# Patient Record
Sex: Male | Born: 1955 | Race: White | Hispanic: No | Marital: Married | Smoking: Never smoker
Health system: Southern US, Community
[De-identification: ages and names within clinical notes are randomized; demographics above are authoritative.]

## PROBLEM LIST (undated history)

## (undated) DIAGNOSIS — N200 Calculus of kidney: Secondary | ICD-10-CM

## (undated) DIAGNOSIS — C4492 Squamous cell carcinoma of skin, unspecified: Secondary | ICD-10-CM

## (undated) DIAGNOSIS — K5790 Diverticulosis of intestine, part unspecified, without perforation or abscess without bleeding: Secondary | ICD-10-CM

## (undated) DIAGNOSIS — K649 Unspecified hemorrhoids: Secondary | ICD-10-CM

## (undated) HISTORY — PX: VASECTOMY: SHX75

## (undated) HISTORY — PX: OTHER SURGICAL HISTORY: SHX169

## (undated) HISTORY — DX: Squamous cell carcinoma of skin, unspecified: C44.92

## (undated) HISTORY — DX: Unspecified hemorrhoids: K64.9

## (undated) HISTORY — DX: Calculus of kidney: N20.0

## (undated) HISTORY — DX: Diverticulosis of intestine, part unspecified, without perforation or abscess without bleeding: K57.90

## (undated) HISTORY — PX: AV FISTULA REPAIR: SHX563

---

## 2000-08-16 ENCOUNTER — Emergency Department (HOSPITAL_COMMUNITY): Admission: EM | Admit: 2000-08-16 | Discharge: 2000-08-16 | Payer: Self-pay | Admitting: Emergency Medicine

## 2000-08-16 ENCOUNTER — Encounter: Payer: Self-pay | Admitting: Emergency Medicine

## 2007-04-11 ENCOUNTER — Encounter: Admission: RE | Admit: 2007-04-11 | Discharge: 2007-04-11 | Payer: Self-pay | Admitting: Family Medicine

## 2008-04-24 ENCOUNTER — Encounter: Admission: RE | Admit: 2008-04-24 | Discharge: 2008-04-24 | Payer: Self-pay | Admitting: Specialist

## 2010-05-05 ENCOUNTER — Observation Stay (HOSPITAL_COMMUNITY)
Admission: EM | Admit: 2010-05-05 | Discharge: 2010-05-07 | Payer: Self-pay | Source: Home / Self Care | Attending: Internal Medicine | Admitting: Internal Medicine

## 2010-05-06 ENCOUNTER — Other Ambulatory Visit: Payer: Self-pay | Admitting: Internal Medicine

## 2010-07-14 LAB — CARDIAC PANEL(CRET KIN+CKTOT+MB+TROPI)
CK, MB: 1 ng/mL (ref 0.3–4.0)
Total CK: 91 U/L (ref 7–232)
Total CK: 98 U/L (ref 7–232)
Troponin I: 0.02 ng/mL (ref 0.00–0.06)
Troponin I: 0.06 ng/mL (ref 0.00–0.06)

## 2010-07-14 LAB — CBC
HCT: 45.3 % (ref 39.0–52.0)
Hemoglobin: 15.7 g/dL (ref 13.0–17.0)
MCHC: 34.7 g/dL (ref 30.0–36.0)
RBC: 5.01 MIL/uL (ref 4.22–5.81)

## 2010-07-14 LAB — DIFFERENTIAL
Lymphs Abs: 1.3 10*3/uL (ref 0.7–4.0)
Monocytes Absolute: 0.4 10*3/uL (ref 0.1–1.0)
Monocytes Relative: 8 % (ref 3–12)
Neutro Abs: 3.8 10*3/uL (ref 1.7–7.7)
Neutrophils Relative %: 67 % (ref 43–77)

## 2010-07-14 LAB — POCT I-STAT, CHEM 8
BUN: 12 mg/dL (ref 6–23)
Calcium, Ion: 1.1 mmol/L — ABNORMAL LOW (ref 1.12–1.32)
Chloride: 105 mEq/L (ref 96–112)
Creatinine, Ser: 1.1 mg/dL (ref 0.4–1.5)
Glucose, Bld: 127 mg/dL — ABNORMAL HIGH (ref 70–99)
HCT: 47 % (ref 39.0–52.0)
Potassium: 3.9 mEq/L (ref 3.5–5.1)

## 2010-07-14 LAB — PROTIME-INR: Prothrombin Time: 13.2 seconds (ref 11.6–15.2)

## 2010-07-14 LAB — CULTURE, BLOOD (ROUTINE X 2)
Culture  Setup Time: 201201022322
Culture  Setup Time: 201201022322
Culture: NO GROWTH
Culture: NO GROWTH

## 2010-07-14 LAB — PHOSPHORUS: Phosphorus: 3.4 mg/dL (ref 2.3–4.6)

## 2010-07-14 LAB — COMPREHENSIVE METABOLIC PANEL
AST: 26 U/L (ref 0–37)
Albumin: 3.7 g/dL (ref 3.5–5.2)
Alkaline Phosphatase: 54 U/L (ref 39–117)
BUN: 12 mg/dL (ref 6–23)
Chloride: 104 mEq/L (ref 96–112)
GFR calc Af Amer: >60 mL/min (ref >60)
Potassium: 3.6 mEq/L (ref 3.5–5.1)
Total Protein: 6.6 g/dL (ref 6.0–8.3)

## 2010-07-14 LAB — LIPID PANEL
HDL: 41 mg/dL (ref >39)
Triglycerides: 177 mg/dL — ABNORMAL HIGH (ref <150)
VLDL: 35 mg/dL (ref 0–40)

## 2010-07-14 LAB — BRAIN NATRIURETIC PEPTIDE: Pro B Natriuretic peptide (BNP): <30 pg/mL (ref 0.0–100.0)

## 2010-07-14 LAB — CK TOTAL AND CKMB (NOT AT ARMC)
CK, MB: 1.4 ng/mL (ref 0.3–4.0)
Relative Index: 1.1 (ref 0.0–2.5)
Total CK: 130 U/L (ref 7–232)

## 2010-07-14 LAB — TSH: TSH: 0.979 u[IU]/mL (ref 0.350–4.500)

## 2010-07-14 LAB — POCT CARDIAC MARKERS
CKMB, poc: <1 ng/mL — ABNORMAL LOW (ref 1.0–8.0)
Troponin i, poc: <0.05 ng/mL (ref 0.00–0.09)

## 2010-07-14 LAB — TROPONIN I: Troponin I: 0.02 ng/mL (ref 0.00–0.06)

## 2010-08-23 ENCOUNTER — Emergency Department (HOSPITAL_COMMUNITY): Payer: BC Managed Care – PPO

## 2010-08-23 ENCOUNTER — Emergency Department (HOSPITAL_COMMUNITY)
Admission: EM | Admit: 2010-08-23 | Discharge: 2010-08-23 | Disposition: A | Discharge status: Home / Self Care | Payer: BC Managed Care – PPO | Attending: Emergency Medicine | Admitting: Emergency Medicine

## 2010-08-23 DIAGNOSIS — N200 Calculus of kidney: Secondary | ICD-10-CM | POA: Insufficient documentation

## 2010-08-23 DIAGNOSIS — E7800 Pure hypercholesterolemia, unspecified: Secondary | ICD-10-CM | POA: Insufficient documentation

## 2010-08-23 DIAGNOSIS — R1031 Right lower quadrant pain: Secondary | ICD-10-CM | POA: Insufficient documentation

## 2010-08-23 DIAGNOSIS — R10813 Right lower quadrant abdominal tenderness: Secondary | ICD-10-CM | POA: Insufficient documentation

## 2010-08-23 DIAGNOSIS — R110 Nausea: Secondary | ICD-10-CM | POA: Insufficient documentation

## 2010-08-23 LAB — POCT I-STAT, CHEM 8
BUN: 24 mg/dL — ABNORMAL HIGH (ref 6–23)
Calcium, Ion: 1.12 mmol/L (ref 1.12–1.32)
Chloride: 109 mEq/L (ref 96–112)
Glucose, Bld: 102 mg/dL — ABNORMAL HIGH (ref 70–99)

## 2010-08-23 LAB — DIFFERENTIAL
Basophils Absolute: 0 10*3/uL (ref 0.0–0.1)
Basophils Relative: 1 % (ref 0–1)
Eosinophils Absolute: 0.1 10*3/uL (ref 0.0–0.7)
Eosinophils Relative: 2 % (ref 0–5)

## 2010-08-23 LAB — CBC
Platelets: 186 10*3/uL (ref 150–400)
RDW: 12.8 % (ref 11.5–15.5)
WBC: 5.5 10*3/uL (ref 4.0–10.5)

## 2010-08-23 LAB — HEPATIC FUNCTION PANEL
AST: 25 U/L (ref 0–37)
Albumin: 4.1 g/dL (ref 3.5–5.2)
Bilirubin, Direct: 0.2 mg/dL (ref 0.0–0.3)
Total Protein: 7.6 g/dL (ref 6.0–8.3)

## 2010-08-23 LAB — URINALYSIS, ROUTINE W REFLEX MICROSCOPIC
Ketones, ur: NEGATIVE mg/dL
Nitrite: NEGATIVE
Protein, ur: NEGATIVE mg/dL

## 2010-08-23 MED ORDER — IOHEXOL 300 MG/ML  SOLN
120.0000 mL | Freq: Once | INTRAMUSCULAR | Status: AC | PRN
  Administered 2010-08-23: 120 mL via INTRAVENOUS

## 2011-07-13 DIAGNOSIS — C439 Malignant melanoma of skin, unspecified: Secondary | ICD-10-CM

## 2011-07-13 HISTORY — DX: Malignant melanoma of skin, unspecified: C43.9

## 2014-05-10 ENCOUNTER — Encounter: Payer: Self-pay | Admitting: *Deleted

## 2015-09-10 DIAGNOSIS — C4492 Squamous cell carcinoma of skin, unspecified: Secondary | ICD-10-CM

## 2015-09-10 HISTORY — DX: Squamous cell carcinoma of skin, unspecified: C44.92

## 2016-05-29 DIAGNOSIS — I10 Essential (primary) hypertension: Secondary | ICD-10-CM | POA: Diagnosis not present

## 2016-06-16 DIAGNOSIS — M4316 Spondylolisthesis, lumbar region: Secondary | ICD-10-CM | POA: Diagnosis not present

## 2016-06-29 DIAGNOSIS — J069 Acute upper respiratory infection, unspecified: Secondary | ICD-10-CM | POA: Diagnosis not present

## 2016-06-29 DIAGNOSIS — R05 Cough: Secondary | ICD-10-CM | POA: Diagnosis not present

## 2016-07-22 DIAGNOSIS — J069 Acute upper respiratory infection, unspecified: Secondary | ICD-10-CM | POA: Diagnosis not present

## 2016-09-01 DIAGNOSIS — E7800 Pure hypercholesterolemia, unspecified: Secondary | ICD-10-CM | POA: Diagnosis not present

## 2016-09-01 DIAGNOSIS — Z0000 Encounter for general adult medical examination without abnormal findings: Secondary | ICD-10-CM | POA: Diagnosis not present

## 2016-09-14 DIAGNOSIS — M4316 Spondylolisthesis, lumbar region: Secondary | ICD-10-CM | POA: Diagnosis not present

## 2016-10-27 DIAGNOSIS — R109 Unspecified abdominal pain: Secondary | ICD-10-CM | POA: Diagnosis not present

## 2016-10-27 DIAGNOSIS — N201 Calculus of ureter: Secondary | ICD-10-CM | POA: Diagnosis not present

## 2016-10-27 DIAGNOSIS — N132 Hydronephrosis with renal and ureteral calculous obstruction: Secondary | ICD-10-CM | POA: Diagnosis not present

## 2016-12-22 DIAGNOSIS — M4316 Spondylolisthesis, lumbar region: Secondary | ICD-10-CM | POA: Diagnosis not present

## 2017-03-04 DIAGNOSIS — I10 Essential (primary) hypertension: Secondary | ICD-10-CM | POA: Diagnosis not present

## 2017-03-04 DIAGNOSIS — Z23 Encounter for immunization: Secondary | ICD-10-CM | POA: Diagnosis not present

## 2017-04-14 DIAGNOSIS — S20212A Contusion of left front wall of thorax, initial encounter: Secondary | ICD-10-CM | POA: Diagnosis not present

## 2017-04-22 DIAGNOSIS — S20219A Contusion of unspecified front wall of thorax, initial encounter: Secondary | ICD-10-CM | POA: Diagnosis not present

## 2017-04-22 DIAGNOSIS — R109 Unspecified abdominal pain: Secondary | ICD-10-CM | POA: Diagnosis not present

## 2017-05-10 DIAGNOSIS — Z23 Encounter for immunization: Secondary | ICD-10-CM | POA: Diagnosis not present

## 2017-05-15 DIAGNOSIS — J069 Acute upper respiratory infection, unspecified: Secondary | ICD-10-CM | POA: Diagnosis not present

## 2017-09-30 DIAGNOSIS — Z1159 Encounter for screening for other viral diseases: Secondary | ICD-10-CM | POA: Diagnosis not present

## 2017-09-30 DIAGNOSIS — E7800 Pure hypercholesterolemia, unspecified: Secondary | ICD-10-CM | POA: Diagnosis not present

## 2017-09-30 DIAGNOSIS — Z0000 Encounter for general adult medical examination without abnormal findings: Secondary | ICD-10-CM | POA: Diagnosis not present

## 2018-03-07 DIAGNOSIS — Z23 Encounter for immunization: Secondary | ICD-10-CM | POA: Diagnosis not present

## 2018-03-07 DIAGNOSIS — I10 Essential (primary) hypertension: Secondary | ICD-10-CM | POA: Diagnosis not present

## 2018-10-10 DIAGNOSIS — Z0000 Encounter for general adult medical examination without abnormal findings: Secondary | ICD-10-CM | POA: Diagnosis not present

## 2018-10-18 DIAGNOSIS — Z125 Encounter for screening for malignant neoplasm of prostate: Secondary | ICD-10-CM | POA: Diagnosis not present

## 2018-10-18 DIAGNOSIS — R5383 Other fatigue: Secondary | ICD-10-CM | POA: Diagnosis not present

## 2018-10-18 DIAGNOSIS — I10 Essential (primary) hypertension: Secondary | ICD-10-CM | POA: Diagnosis not present

## 2018-10-18 DIAGNOSIS — N529 Male erectile dysfunction, unspecified: Secondary | ICD-10-CM | POA: Diagnosis not present

## 2018-11-25 DIAGNOSIS — Z79890 Hormone replacement therapy (postmenopausal): Secondary | ICD-10-CM | POA: Diagnosis not present

## 2018-11-25 DIAGNOSIS — E291 Testicular hypofunction: Secondary | ICD-10-CM | POA: Diagnosis not present

## 2018-12-06 DIAGNOSIS — E291 Testicular hypofunction: Secondary | ICD-10-CM | POA: Diagnosis not present

## 2018-12-16 DIAGNOSIS — E291 Testicular hypofunction: Secondary | ICD-10-CM | POA: Diagnosis not present

## 2019-04-11 DIAGNOSIS — Z23 Encounter for immunization: Secondary | ICD-10-CM | POA: Diagnosis not present

## 2019-04-11 DIAGNOSIS — I10 Essential (primary) hypertension: Secondary | ICD-10-CM | POA: Diagnosis not present

## 2019-04-11 DIAGNOSIS — E291 Testicular hypofunction: Secondary | ICD-10-CM | POA: Diagnosis not present

## 2019-05-16 DIAGNOSIS — F5221 Male erectile disorder: Secondary | ICD-10-CM | POA: Diagnosis not present

## 2019-06-06 DIAGNOSIS — N5201 Erectile dysfunction due to arterial insufficiency: Secondary | ICD-10-CM | POA: Diagnosis not present

## 2019-06-20 DIAGNOSIS — Z85820 Personal history of malignant melanoma of skin: Secondary | ICD-10-CM | POA: Diagnosis not present

## 2019-06-20 DIAGNOSIS — L570 Actinic keratosis: Secondary | ICD-10-CM | POA: Diagnosis not present

## 2019-06-20 DIAGNOSIS — B079 Viral wart, unspecified: Secondary | ICD-10-CM | POA: Diagnosis not present

## 2019-06-20 DIAGNOSIS — D229 Melanocytic nevi, unspecified: Secondary | ICD-10-CM | POA: Diagnosis not present

## 2019-08-01 ENCOUNTER — Ambulatory Visit (INDEPENDENT_AMBULATORY_CARE_PROVIDER_SITE_OTHER): Payer: BC Managed Care – PPO | Admitting: Dermatology

## 2019-08-01 ENCOUNTER — Other Ambulatory Visit: Payer: Self-pay

## 2019-08-01 ENCOUNTER — Encounter: Payer: Self-pay | Admitting: Dermatology

## 2019-08-01 DIAGNOSIS — L570 Actinic keratosis: Secondary | ICD-10-CM

## 2019-08-01 NOTE — Progress Notes (Addendum)
   Follow-Up Visit    Subjective  Timothy Parker is a 64 y.o. male who presents for the following: Follow-up (Previous visit LN2 forearms left and right also both hands. Some spots are gone and some are present.).  AKs Location: Arms and hands  Duration: Months Quality: Improved not clear Associated Signs/Symptoms: Sometimes itch Modifying Factors: Previous freezing Severity:  Timing: Context:   The following portions of the chart were reviewed this encounter and updated as appropriate:     Objective  Well appearing patient in no apparent distress; mood and affect are within normal limits.  A focused examination was performed including hands, arms, face, head and neck. Relevant physical exam findings are noted in the Assessment and Plan. Four residual H-AKs.  Assessment & Plan  AK (actinic keratosis) (4) Left Hand - Posterior; Right Hand - Posterior; Left Forearm - Posterior; Right Forearm - Posterior  Destruction of lesion - Left Forearm - Posterior, Left Hand - Posterior, Right Forearm - Posterior, Right Hand - Posterior Complexity: simple   Destruction method: cryotherapy   Informed consent: discussed and consent obtained   Timeout:  patient name, date of birth, surgical site, and procedure verified Lesion destroyed using liquid nitrogen: Yes   Region frozen until ice ball extended beyond lesion: Yes   Outcome: patient tolerated procedure well with no complications   Post-procedure details: wound care instructions given   LN2 freeze, 5 seconds each.

## 2019-10-10 DIAGNOSIS — E291 Testicular hypofunction: Secondary | ICD-10-CM | POA: Diagnosis not present

## 2019-10-10 DIAGNOSIS — Z0000 Encounter for general adult medical examination without abnormal findings: Secondary | ICD-10-CM | POA: Diagnosis not present

## 2019-10-10 DIAGNOSIS — I10 Essential (primary) hypertension: Secondary | ICD-10-CM | POA: Diagnosis not present

## 2019-10-10 DIAGNOSIS — E7800 Pure hypercholesterolemia, unspecified: Secondary | ICD-10-CM | POA: Diagnosis not present

## 2019-10-10 DIAGNOSIS — Z125 Encounter for screening for malignant neoplasm of prostate: Secondary | ICD-10-CM | POA: Diagnosis not present

## 2019-12-01 DIAGNOSIS — M940 Chondrocostal junction syndrome [Tietze]: Secondary | ICD-10-CM | POA: Diagnosis not present

## 2020-07-30 DIAGNOSIS — R972 Elevated prostate specific antigen [PSA]: Secondary | ICD-10-CM | POA: Diagnosis not present

## 2020-07-30 DIAGNOSIS — I10 Essential (primary) hypertension: Secondary | ICD-10-CM | POA: Diagnosis not present

## 2020-07-30 DIAGNOSIS — N529 Male erectile dysfunction, unspecified: Secondary | ICD-10-CM | POA: Diagnosis not present

## 2020-07-30 DIAGNOSIS — E291 Testicular hypofunction: Secondary | ICD-10-CM | POA: Diagnosis not present

## 2020-12-17 DIAGNOSIS — H25811 Combined forms of age-related cataract, right eye: Secondary | ICD-10-CM | POA: Diagnosis not present

## 2021-01-02 DIAGNOSIS — H2511 Age-related nuclear cataract, right eye: Secondary | ICD-10-CM | POA: Diagnosis not present

## 2021-01-02 DIAGNOSIS — H25811 Combined forms of age-related cataract, right eye: Secondary | ICD-10-CM | POA: Diagnosis not present

## 2021-01-16 DIAGNOSIS — H2512 Age-related nuclear cataract, left eye: Secondary | ICD-10-CM | POA: Diagnosis not present

## 2021-05-20 DIAGNOSIS — U071 COVID-19: Secondary | ICD-10-CM | POA: Diagnosis not present

## 2021-05-27 DIAGNOSIS — E7800 Pure hypercholesterolemia, unspecified: Secondary | ICD-10-CM | POA: Diagnosis not present

## 2021-05-27 DIAGNOSIS — I10 Essential (primary) hypertension: Secondary | ICD-10-CM | POA: Diagnosis not present

## 2021-05-27 DIAGNOSIS — Z23 Encounter for immunization: Secondary | ICD-10-CM | POA: Diagnosis not present

## 2021-05-27 DIAGNOSIS — Z0000 Encounter for general adult medical examination without abnormal findings: Secondary | ICD-10-CM | POA: Diagnosis not present

## 2021-05-27 DIAGNOSIS — R972 Elevated prostate specific antigen [PSA]: Secondary | ICD-10-CM | POA: Diagnosis not present

## 2021-06-03 DIAGNOSIS — N5201 Erectile dysfunction due to arterial insufficiency: Secondary | ICD-10-CM | POA: Diagnosis not present

## 2021-06-03 DIAGNOSIS — R972 Elevated prostate specific antigen [PSA]: Secondary | ICD-10-CM | POA: Diagnosis not present

## 2021-08-18 DIAGNOSIS — R972 Elevated prostate specific antigen [PSA]: Secondary | ICD-10-CM | POA: Diagnosis not present

## 2021-09-05 DIAGNOSIS — R972 Elevated prostate specific antigen [PSA]: Secondary | ICD-10-CM | POA: Diagnosis not present

## 2021-09-05 DIAGNOSIS — N3943 Post-void dribbling: Secondary | ICD-10-CM | POA: Diagnosis not present

## 2021-09-09 ENCOUNTER — Other Ambulatory Visit: Payer: Self-pay | Admitting: Urology

## 2021-09-09 DIAGNOSIS — R972 Elevated prostate specific antigen [PSA]: Secondary | ICD-10-CM

## 2021-09-30 ENCOUNTER — Ambulatory Visit
Admission: RE | Admit: 2021-09-30 | Discharge: 2021-09-30 | Disposition: A | Discharge status: Home / Self Care | Payer: Medicare Other | Source: Ambulatory Visit | Attending: Urology | Admitting: Urology

## 2021-09-30 DIAGNOSIS — K5730 Diverticulosis of large intestine without perforation or abscess without bleeding: Secondary | ICD-10-CM | POA: Diagnosis not present

## 2021-09-30 DIAGNOSIS — R972 Elevated prostate specific antigen [PSA]: Secondary | ICD-10-CM | POA: Diagnosis not present

## 2021-09-30 MED ORDER — GADOBENATE DIMEGLUMINE 529 MG/ML IV SOLN
20.0000 mL | Freq: Once | INTRAVENOUS | Status: AC | PRN
  Administered 2021-09-30: 20 mL via INTRAVENOUS

## 2021-12-02 DIAGNOSIS — R972 Elevated prostate specific antigen [PSA]: Secondary | ICD-10-CM | POA: Diagnosis not present

## 2021-12-09 DIAGNOSIS — N3943 Post-void dribbling: Secondary | ICD-10-CM | POA: Diagnosis not present

## 2021-12-09 DIAGNOSIS — R972 Elevated prostate specific antigen [PSA]: Secondary | ICD-10-CM | POA: Diagnosis not present

## 2022-05-07 DIAGNOSIS — E7800 Pure hypercholesterolemia, unspecified: Secondary | ICD-10-CM | POA: Diagnosis not present

## 2022-05-07 DIAGNOSIS — I10 Essential (primary) hypertension: Secondary | ICD-10-CM | POA: Diagnosis not present

## 2022-05-07 DIAGNOSIS — R972 Elevated prostate specific antigen [PSA]: Secondary | ICD-10-CM | POA: Diagnosis not present

## 2022-06-08 DIAGNOSIS — R972 Elevated prostate specific antigen [PSA]: Secondary | ICD-10-CM | POA: Diagnosis not present

## 2022-06-15 DIAGNOSIS — N3943 Post-void dribbling: Secondary | ICD-10-CM | POA: Diagnosis not present

## 2022-06-15 DIAGNOSIS — R972 Elevated prostate specific antigen [PSA]: Secondary | ICD-10-CM | POA: Diagnosis not present

## 2022-08-10 DIAGNOSIS — D075 Carcinoma in situ of prostate: Secondary | ICD-10-CM | POA: Diagnosis not present

## 2022-08-10 DIAGNOSIS — R972 Elevated prostate specific antigen [PSA]: Secondary | ICD-10-CM | POA: Diagnosis not present

## 2022-09-01 DIAGNOSIS — M25512 Pain in left shoulder: Secondary | ICD-10-CM | POA: Diagnosis not present

## 2022-09-09 DIAGNOSIS — M25612 Stiffness of left shoulder, not elsewhere classified: Secondary | ICD-10-CM | POA: Diagnosis not present

## 2022-09-09 DIAGNOSIS — M6281 Muscle weakness (generalized): Secondary | ICD-10-CM | POA: Diagnosis not present

## 2022-09-09 DIAGNOSIS — S46012D Strain of muscle(s) and tendon(s) of the rotator cuff of left shoulder, subsequent encounter: Secondary | ICD-10-CM | POA: Diagnosis not present

## 2022-09-09 DIAGNOSIS — M7542 Impingement syndrome of left shoulder: Secondary | ICD-10-CM | POA: Diagnosis not present

## 2022-09-16 DIAGNOSIS — M6281 Muscle weakness (generalized): Secondary | ICD-10-CM | POA: Diagnosis not present

## 2022-09-16 DIAGNOSIS — S46012D Strain of muscle(s) and tendon(s) of the rotator cuff of left shoulder, subsequent encounter: Secondary | ICD-10-CM | POA: Diagnosis not present

## 2022-09-16 DIAGNOSIS — M25612 Stiffness of left shoulder, not elsewhere classified: Secondary | ICD-10-CM | POA: Diagnosis not present

## 2022-09-16 DIAGNOSIS — M7542 Impingement syndrome of left shoulder: Secondary | ICD-10-CM | POA: Diagnosis not present

## 2022-09-24 DIAGNOSIS — M25612 Stiffness of left shoulder, not elsewhere classified: Secondary | ICD-10-CM | POA: Diagnosis not present

## 2022-09-24 DIAGNOSIS — S46012D Strain of muscle(s) and tendon(s) of the rotator cuff of left shoulder, subsequent encounter: Secondary | ICD-10-CM | POA: Diagnosis not present

## 2022-09-24 DIAGNOSIS — M6281 Muscle weakness (generalized): Secondary | ICD-10-CM | POA: Diagnosis not present

## 2022-09-24 DIAGNOSIS — M7542 Impingement syndrome of left shoulder: Secondary | ICD-10-CM | POA: Diagnosis not present

## 2022-10-01 DIAGNOSIS — M25612 Stiffness of left shoulder, not elsewhere classified: Secondary | ICD-10-CM | POA: Diagnosis not present

## 2022-10-01 DIAGNOSIS — M7542 Impingement syndrome of left shoulder: Secondary | ICD-10-CM | POA: Diagnosis not present

## 2022-10-01 DIAGNOSIS — M6281 Muscle weakness (generalized): Secondary | ICD-10-CM | POA: Diagnosis not present

## 2022-10-01 DIAGNOSIS — S46012D Strain of muscle(s) and tendon(s) of the rotator cuff of left shoulder, subsequent encounter: Secondary | ICD-10-CM | POA: Diagnosis not present

## 2022-10-13 DIAGNOSIS — M7542 Impingement syndrome of left shoulder: Secondary | ICD-10-CM | POA: Diagnosis not present

## 2022-10-27 DIAGNOSIS — Z0000 Encounter for general adult medical examination without abnormal findings: Secondary | ICD-10-CM | POA: Diagnosis not present

## 2022-10-27 DIAGNOSIS — I10 Essential (primary) hypertension: Secondary | ICD-10-CM | POA: Diagnosis not present

## 2022-10-27 DIAGNOSIS — E7800 Pure hypercholesterolemia, unspecified: Secondary | ICD-10-CM | POA: Diagnosis not present

## 2022-10-27 DIAGNOSIS — M109 Gout, unspecified: Secondary | ICD-10-CM | POA: Diagnosis not present

## 2022-10-27 DIAGNOSIS — K219 Gastro-esophageal reflux disease without esophagitis: Secondary | ICD-10-CM | POA: Diagnosis not present

## 2022-10-27 DIAGNOSIS — E669 Obesity, unspecified: Secondary | ICD-10-CM | POA: Diagnosis not present

## 2022-12-01 DIAGNOSIS — E7800 Pure hypercholesterolemia, unspecified: Secondary | ICD-10-CM | POA: Diagnosis not present

## 2023-02-02 DIAGNOSIS — R972 Elevated prostate specific antigen [PSA]: Secondary | ICD-10-CM | POA: Diagnosis not present

## 2023-02-09 DIAGNOSIS — N5201 Erectile dysfunction due to arterial insufficiency: Secondary | ICD-10-CM | POA: Diagnosis not present

## 2023-02-09 DIAGNOSIS — N3943 Post-void dribbling: Secondary | ICD-10-CM | POA: Diagnosis not present

## 2023-03-12 IMAGING — MR MR PROSTATE WO/W CM
12 series · 48 of 48 positions shown · IV contrast (multihance)
Comparison: None Available.

CLINICAL DATA: Elevated PSA.

EXAM:
MR PROSTATE WITHOUT AND WITH CONTRAST
TECHNIQUE: Multiplanar multisequence MRI images were obtained of the pelvis
centered about the prostate. Pre and post contrast images were
obtained.
CONTRAST:  20mL MULTIHANCE GADOBENATE DIMEGLUMINE 529 MG/ML IV SOLN

[Series 3: T2 · coronal · 3.0mm · 0.56mm/px · 1 of 24 slices shown (1 of 3)]
[im 1/24]
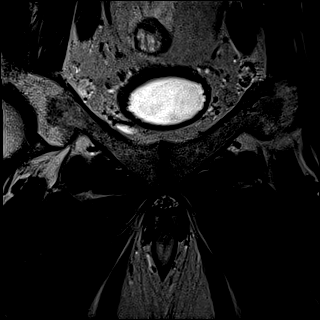

[Series 4: T1 · axial · 5.0mm · 1.25mm/px · z∈[-80,+155]mm · 2 of 96 slices shown]
[im 1/96]
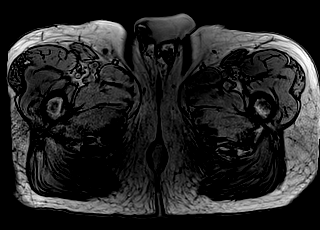
[im 96/96]
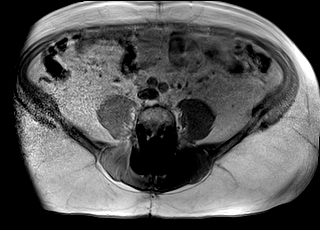

[Series 5: DWI · axial · 3.0mm · 1.75mm/px · z∈[-54,+33]mm · 2 of 90 slices shown (1 of 3)]
[im 1/90]
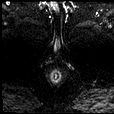
[im 90/90]
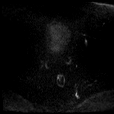

[Series 6: DWI · axial · 3.0mm · 1.75mm/px · 1 of 30 slices shown (2 of 3)]
[im 1/30]
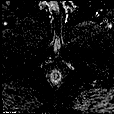

[Series 7: DWI · axial · 3.0mm · 1.75mm/px · 1 of 30 slices shown (3 of 3)]
[im 1/30]
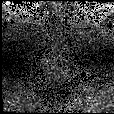

[Series 8: T2 · axial · 3.0mm · 0.56mm/px · 1 of 26 slices shown (2 of 3)]
[im 1/26]
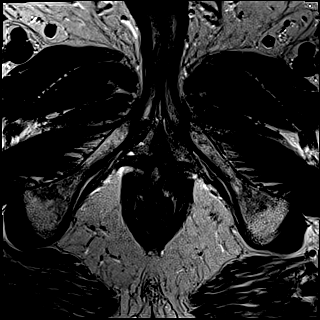

[Series 9: T2 · axial · 1.0mm · 1.04mm/px · z∈[-46,+33]mm · 2 of 80 slices shown (3 of 3)]
[im 1/80]
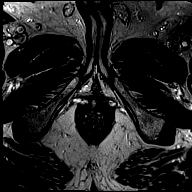
[im 80/80]
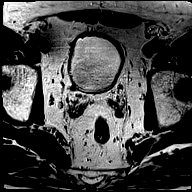

[Series 10: pre t1_twist_tra_dyn · axial · non-contrast · 3.5mm · 0.83mm/px · 1 of 22 slices shown]
[im 1/22]
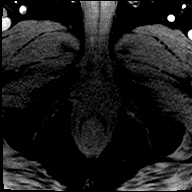

[Series 11: post t1_twist_tra_dyn-copy center · axial · non-contrast · 3.5mm · 0.83mm/px · z∈[-46,+28]mm · 17 of 660 slices shown]
[im 1/660]
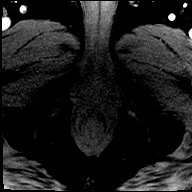
[im 42/660]
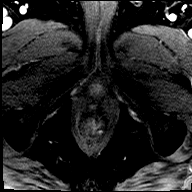
[im 83/660]
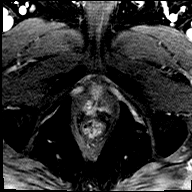
[im 124/660]
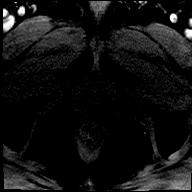
[im 165/660]
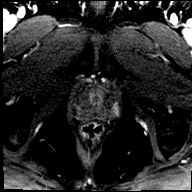
[im 206/660]
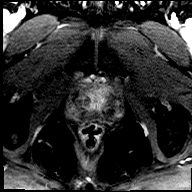
[im 248/660]
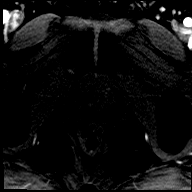
[im 289/660]
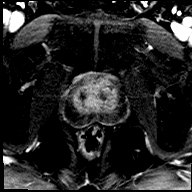
[im 330/660]
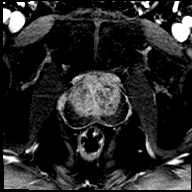
[im 371/660]
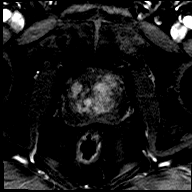
[im 412/660]
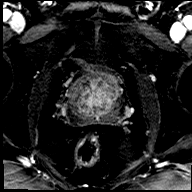
[im 454/660]
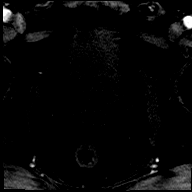
[im 495/660]
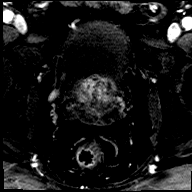
[im 536/660]
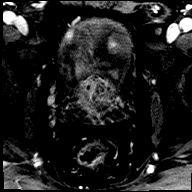
[im 577/660]
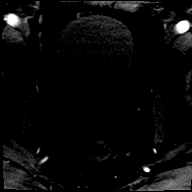
[im 618/660]
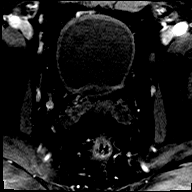
[im 660/660]
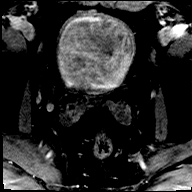

[Series 12: post t1_twist_tra_dyn-copy cent_sub · axial · 3.5mm · 0.83mm/px · z∈[-46,+28]mm · 16 of 638 slices shown]
[im 1/638]
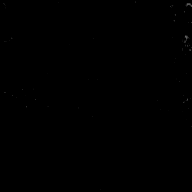
[im 43/638]
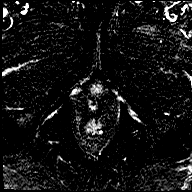
[im 85/638]
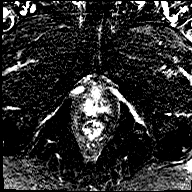
[im 128/638]
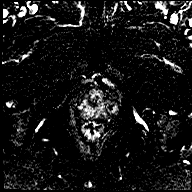
[im 170/638]
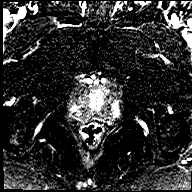
[im 213/638]
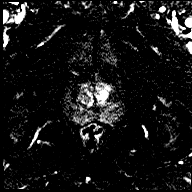
[im 255/638]
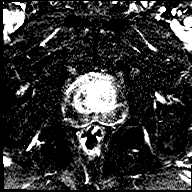
[im 298/638]
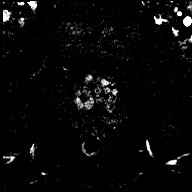
[im 340/638]
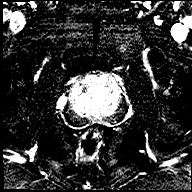
[im 383/638]
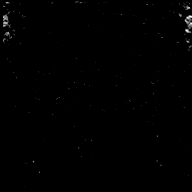
[im 425/638]
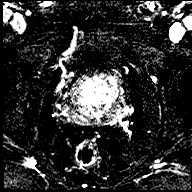
[im 468/638]
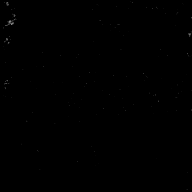
[im 510/638]
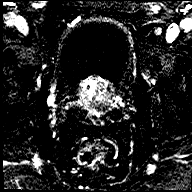
[im 553/638]
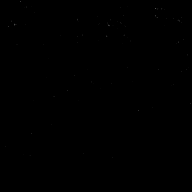
[im 595/638]
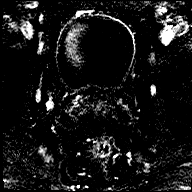
[im 638/638]
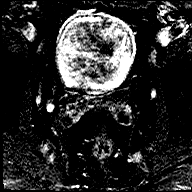

[Series 13: t1_vibe_dixon_tra_f · axial · 2.5mm · 0.91mm/px · z∈[-61,+136]mm · 2 of 80 slices shown]
[im 1/80]
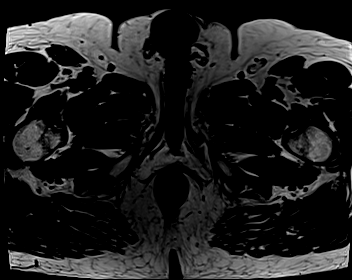
[im 80/80]
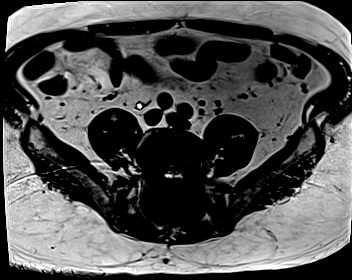

[Series 14: t1_vibe_dixon_tra_w · axial · 2.5mm · 0.91mm/px · z∈[-61,+136]mm · 2 of 80 slices shown]
[im 1/80]
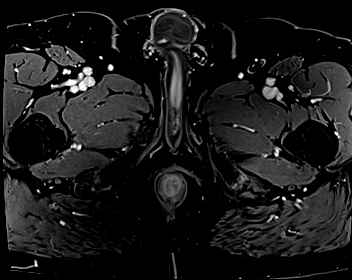
[im 80/80]
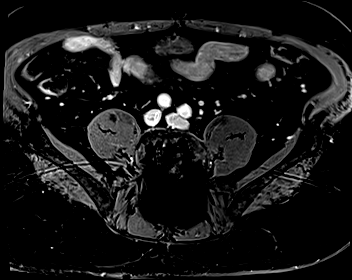

[48 of 48 positions shown; findings below may reference images not displayed]

FINDINGS: Prostate:

-- Peripheral Zone: No focal lesion seen on ADC or high b-value DWI
sequences.

-- Transition/Central Zone: Mild-to-moderately enlarged with
multiple BPH nodules. No nodules with suspicious characteristics on
T2-weighted imaging.

-- Measurements/Volume:  5.6 by 4.3 x 5.6 cm (volume = 71 cm^3)

Transcapsular spread:  Absent

Seminal vesicle involvement:  Absent

Neurovascular bundle involvement:  Absent

Pelvic adenopathy: None visualized

Bone metastasis: None visualized

Other:  Sigmoid diverticulosis, without evidence of diverticulitis.
IMPRESSION: No radiographic evidence of high-grade prostate carcinoma. PI-RADS 1
(v2.1): Very Low (clinically significant cancer highly unlikely)

## 2023-05-13 DIAGNOSIS — D124 Benign neoplasm of descending colon: Secondary | ICD-10-CM | POA: Diagnosis not present

## 2023-05-13 DIAGNOSIS — Z1211 Encounter for screening for malignant neoplasm of colon: Secondary | ICD-10-CM | POA: Diagnosis not present

## 2023-05-13 DIAGNOSIS — K635 Polyp of colon: Secondary | ICD-10-CM | POA: Diagnosis not present

## 2023-05-13 DIAGNOSIS — K5730 Diverticulosis of large intestine without perforation or abscess without bleeding: Secondary | ICD-10-CM | POA: Diagnosis not present

## 2023-05-17 DIAGNOSIS — K635 Polyp of colon: Secondary | ICD-10-CM | POA: Diagnosis not present

## 2023-05-17 DIAGNOSIS — D124 Benign neoplasm of descending colon: Secondary | ICD-10-CM | POA: Diagnosis not present

## 2023-05-18 DIAGNOSIS — I10 Essential (primary) hypertension: Secondary | ICD-10-CM | POA: Diagnosis not present

## 2023-05-18 DIAGNOSIS — E7800 Pure hypercholesterolemia, unspecified: Secondary | ICD-10-CM | POA: Diagnosis not present

## 2023-05-18 DIAGNOSIS — R7303 Prediabetes: Secondary | ICD-10-CM | POA: Diagnosis not present

## 2023-05-20 DIAGNOSIS — E7800 Pure hypercholesterolemia, unspecified: Secondary | ICD-10-CM | POA: Diagnosis not present

## 2023-05-20 DIAGNOSIS — R7303 Prediabetes: Secondary | ICD-10-CM | POA: Diagnosis not present

## 2023-05-20 DIAGNOSIS — I10 Essential (primary) hypertension: Secondary | ICD-10-CM | POA: Diagnosis not present

## 2023-05-20 DIAGNOSIS — N400 Enlarged prostate without lower urinary tract symptoms: Secondary | ICD-10-CM | POA: Diagnosis not present

## 2023-06-22 DIAGNOSIS — L570 Actinic keratosis: Secondary | ICD-10-CM | POA: Diagnosis not present

## 2023-06-22 DIAGNOSIS — L814 Other melanin hyperpigmentation: Secondary | ICD-10-CM | POA: Diagnosis not present

## 2023-06-22 DIAGNOSIS — L578 Other skin changes due to chronic exposure to nonionizing radiation: Secondary | ICD-10-CM | POA: Diagnosis not present

## 2023-06-22 DIAGNOSIS — D225 Melanocytic nevi of trunk: Secondary | ICD-10-CM | POA: Diagnosis not present

## 2023-11-01 DIAGNOSIS — Z125 Encounter for screening for malignant neoplasm of prostate: Secondary | ICD-10-CM | POA: Diagnosis not present

## 2023-11-01 DIAGNOSIS — I10 Essential (primary) hypertension: Secondary | ICD-10-CM | POA: Diagnosis not present

## 2023-11-01 DIAGNOSIS — R7303 Prediabetes: Secondary | ICD-10-CM | POA: Diagnosis not present

## 2023-11-01 DIAGNOSIS — E7800 Pure hypercholesterolemia, unspecified: Secondary | ICD-10-CM | POA: Diagnosis not present

## 2023-11-04 DIAGNOSIS — I10 Essential (primary) hypertension: Secondary | ICD-10-CM | POA: Diagnosis not present

## 2023-11-04 DIAGNOSIS — Z0000 Encounter for general adult medical examination without abnormal findings: Secondary | ICD-10-CM | POA: Diagnosis not present

## 2023-11-04 DIAGNOSIS — R7303 Prediabetes: Secondary | ICD-10-CM | POA: Diagnosis not present

## 2023-11-04 DIAGNOSIS — M109 Gout, unspecified: Secondary | ICD-10-CM | POA: Diagnosis not present

## 2023-11-04 DIAGNOSIS — E7800 Pure hypercholesterolemia, unspecified: Secondary | ICD-10-CM | POA: Diagnosis not present

## 2023-12-02 DIAGNOSIS — Z8249 Family history of ischemic heart disease and other diseases of the circulatory system: Secondary | ICD-10-CM | POA: Diagnosis not present

## 2023-12-02 DIAGNOSIS — Z136 Encounter for screening for cardiovascular disorders: Secondary | ICD-10-CM | POA: Diagnosis not present
